# Patient Record
Sex: Female | Born: 1970 | Race: Black or African American | Hispanic: No | Smoking: Never smoker
Health system: Southern US, Community
[De-identification: ages and names within clinical notes are randomized; demographics above are authoritative.]

## PROBLEM LIST (undated history)

## (undated) DIAGNOSIS — T7840XA Allergy, unspecified, initial encounter: Secondary | ICD-10-CM

## (undated) DIAGNOSIS — N6310 Unspecified lump in the right breast, unspecified quadrant: Secondary | ICD-10-CM

## (undated) HISTORY — DX: Allergy, unspecified, initial encounter: T78.40XA

## (undated) HISTORY — DX: Unspecified lump in the right breast, unspecified quadrant: N63.10

---

## 2006-04-28 ENCOUNTER — Emergency Department (HOSPITAL_COMMUNITY): Admission: EM | Admit: 2006-04-28 | Discharge: 2006-04-28 | Payer: Self-pay | Admitting: Emergency Medicine

## 2008-10-25 ENCOUNTER — Emergency Department (HOSPITAL_BASED_OUTPATIENT_CLINIC_OR_DEPARTMENT_OTHER): Admission: EM | Admit: 2008-10-25 | Discharge: 2008-10-26 | Payer: Self-pay | Admitting: Emergency Medicine

## 2008-10-26 ENCOUNTER — Ambulatory Visit: Payer: Self-pay | Admitting: Diagnostic Radiology
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
## 2011-03-29 ENCOUNTER — Ambulatory Visit (INDEPENDENT_AMBULATORY_CARE_PROVIDER_SITE_OTHER): Payer: Self-pay | Admitting: Family Medicine

## 2011-03-29 ENCOUNTER — Encounter: Payer: Self-pay | Admitting: Family Medicine

## 2011-03-29 DIAGNOSIS — Z124 Encounter for screening for malignant neoplasm of cervix: Secondary | ICD-10-CM

## 2011-03-29 DIAGNOSIS — J4520 Mild intermittent asthma, uncomplicated: Secondary | ICD-10-CM | POA: Insufficient documentation

## 2011-03-29 DIAGNOSIS — Z309 Encounter for contraceptive management, unspecified: Secondary | ICD-10-CM

## 2011-03-29 DIAGNOSIS — G43909 Migraine, unspecified, not intractable, without status migrainosus: Secondary | ICD-10-CM | POA: Insufficient documentation

## 2011-03-29 MED ORDER — NORELGESTROMIN-ETH ESTRADIOL 150-35 MCG/24HR TD PTWK: 1.0000 | MEDICATED_PATCH | TRANSDERMAL | Status: AC

## 2011-03-29 NOTE — Progress Notes (Signed)
  Subjective:     Kim Bailey is a 41 y.o. female here for a routine exam.  Current complaints: none.    Gynecologic History Patient's last menstrual period was 03/13/2011. Contraception: none Last Pap: unsure. Results were: normal Last mammogram: none.  Obstetric History OB History    Grav Para Term Preterm Abortions TAB SAB Ect Mult Living   2 1 1  1  1  0 0 1     # Outc Date GA Lbr Len/2nd Wgt Sex Del Anes PTL Lv   1 TRM 1/91 [redacted]w[redacted]d  5lb5oz(2.41kg) F SVD None     2 SAB                The following portions of the patient's history were reviewed and updated as appropriate: allergies, current medications, past family history, past medical history, past social history, past surgical history and problem list.  Review of Systems Pertinent items are noted in HPI.    Objective:    BP 112/66  Pulse 84  Temp(Src) 98.4 F (36.9 C) (Oral)  Ht 5' 5.1" (1.654 m)  Wt 126 lb 1.6 oz (57.199 kg)  BMI 20.92 kg/m2  LMP 03/13/2011  General Appearance:    Alert, cooperative, no distress, appears stated age  Head:    Normocephalic, without obvious abnormality, atraumatic  Eyes:    PERRL, conjunctiva/corneas clear, EOM's intact, fundi    benign, both eyes  Ears:    Normal TM's and external ear canals, both ears  Nose:   Nares normal, septum midline, mucosa normal, no drainage    or sinus tenderness  Throat:   Lips, mucosa, and tongue normal; teeth and gums normal  Neck:   Supple, symmetrical, trachea midline, no adenopathy;    thyroid:  no enlargement/tenderness/nodules; no carotid   bruit or JVD  Back:     Symmetric, no curvature, ROM normal, no CVA tenderness  Lungs:     Clear to auscultation bilaterally, respirations unlabored  Chest Wall:    No tenderness or deformity   Heart:    Regular rate and rhythm, S1 and S2 normal, no murmur, rub   or gallop  Breast Exam:    No tenderness, masses, or nipple abnormality  Abdomen:     Soft, non-tender, bowel sounds active all four  quadrants,    no masses, no organomegaly  Genitalia:    Normal female without lesion, discharge or tenderness  Extremities:   Extremities normal, atraumatic, no cyanosis or edema  Pulses:   2+ and symmetric all extremities  Skin:   Skin color, texture, turgor normal, no rashes or lesions  Lymph nodes:   Cervical, supraclavicular, and axillary nodes normal  Neurologic:   CNII-XII intact, normal strength, sensation and reflexes    throughout      Assessment:    Healthy female exam.    Plan:    Education reviewed: self breast exams. Contraception: Ortho-Evra patches weekly. Follow up in: 1 year.

## 2011-04-04 ENCOUNTER — Other Ambulatory Visit: Payer: Self-pay | Admitting: *Deleted

## 2011-04-04 ENCOUNTER — Telehealth: Payer: Self-pay | Admitting: *Deleted

## 2011-04-04 ENCOUNTER — Other Ambulatory Visit: Payer: Self-pay | Admitting: Family Medicine

## 2011-04-04 DIAGNOSIS — Z3049 Encounter for surveillance of other contraceptives: Secondary | ICD-10-CM

## 2011-04-04 MED ORDER — LEVONORGEST-ETH ESTRAD 91-DAY 0.15-0.03 MG PO TABS: 1.0000 | ORAL_TABLET | Freq: Every day | ORAL | Status: DC

## 2011-04-04 NOTE — Telephone Encounter (Signed)
Okay to change to Seasonale.  I do not see a pharmacy listed, or my previous note, although I remember the conversation.  Please contact patient and send prescription to correct pharmacy.

## 2011-04-04 NOTE — Telephone Encounter (Signed)
Called patient and informed her we would send in a prescription for Seasonale later today.

## 2011-04-04 NOTE — Telephone Encounter (Signed)
Patient called  And states she is calling for Dr. Adrian Blackwater- had a prescription sent to [facility] for [facility]- which is too expensive and he is supposed to change it to [place]. Please call me. Called patient and said Dr. Adrian Blackwater told her if it was too expensive call back and he would change it. Informed patient would send request to Dr. Adrian Blackwater- and when he sent it in, we would call her. Advised her to call her pharmacy and check on price for seasonale in case it is too expensive too.

## 2012-04-23 ENCOUNTER — Other Ambulatory Visit: Payer: Self-pay | Admitting: Family

## 2012-04-23 DIAGNOSIS — Z3049 Encounter for surveillance of other contraceptives: Secondary | ICD-10-CM

## 2012-04-23 MED ORDER — LEVONORGEST-ETH ESTRAD 91-DAY 0.15-0.03 MG PO TABS: 1.0000 | ORAL_TABLET | Freq: Every day | ORAL | Status: AC

## 2013-04-30 ENCOUNTER — Other Ambulatory Visit: Payer: Self-pay | Admitting: Family

## 2013-04-30 NOTE — Progress Notes (Signed)
Pt has recently moved and unable to get in with GYN provider soon and needs refill in oral contraception.  RX called to [facility] in [place] for seasonale, refills 11.  RX called in under her non-married name, Kim Bailey.

## 2013-07-20 ENCOUNTER — Other Ambulatory Visit: Payer: Self-pay | Admitting: Family

## 2014-01-17 ENCOUNTER — Encounter: Payer: Self-pay | Admitting: Family Medicine
